# Patient Record
Sex: Female | Born: 1965 | Hispanic: No | Marital: Married | Smoking: Current every day smoker
Health system: Southern US, Community
[De-identification: ages and names within clinical notes are randomized; demographics above are authoritative.]

## PROBLEM LIST (undated history)

## (undated) DIAGNOSIS — R109 Unspecified abdominal pain: Secondary | ICD-10-CM

## (undated) DIAGNOSIS — J351 Hypertrophy of tonsils: Secondary | ICD-10-CM

## (undated) DIAGNOSIS — F329 Major depressive disorder, single episode, unspecified: Secondary | ICD-10-CM

## (undated) DIAGNOSIS — J019 Acute sinusitis, unspecified: Secondary | ICD-10-CM

## (undated) DIAGNOSIS — D649 Anemia, unspecified: Secondary | ICD-10-CM

## (undated) DIAGNOSIS — E66813 Obesity, class 3: Secondary | ICD-10-CM

## (undated) DIAGNOSIS — F32A Depression, unspecified: Secondary | ICD-10-CM

## (undated) DIAGNOSIS — F419 Anxiety disorder, unspecified: Secondary | ICD-10-CM

## (undated) DIAGNOSIS — N390 Urinary tract infection, site not specified: Secondary | ICD-10-CM

## (undated) DIAGNOSIS — I10 Essential (primary) hypertension: Secondary | ICD-10-CM

## (undated) DIAGNOSIS — R002 Palpitations: Secondary | ICD-10-CM

## (undated) DIAGNOSIS — K921 Melena: Secondary | ICD-10-CM

## (undated) DIAGNOSIS — M542 Cervicalgia: Secondary | ICD-10-CM

## (undated) HISTORY — DX: Depression, unspecified: F32.A

## (undated) HISTORY — DX: Palpitations: R00.2

## (undated) HISTORY — DX: Cervicalgia: M54.2

## (undated) HISTORY — DX: Hypertrophy of tonsils: J35.1

## (undated) HISTORY — DX: Anemia, unspecified: D64.9

## (undated) HISTORY — DX: Urinary tract infection, site not specified: N39.0

## (undated) HISTORY — DX: Anxiety disorder, unspecified: F41.9

## (undated) HISTORY — DX: Melena: K92.1

## (undated) HISTORY — DX: Morbid (severe) obesity due to excess calories: E66.01

## (undated) HISTORY — DX: Essential (primary) hypertension: I10

## (undated) HISTORY — DX: Acute sinusitis, unspecified: J01.90

## (undated) HISTORY — DX: Obesity, class 3: E66.813

## (undated) HISTORY — PX: OTHER SURGICAL HISTORY: SHX169

## (undated) HISTORY — PX: HYSTEROSCOPY WITH D & C: SHX1775

## (undated) HISTORY — PX: CHOLECYSTECTOMY: SHX55

## (undated) HISTORY — PX: CARPAL TUNNEL RELEASE: SHX101

## (undated) HISTORY — DX: Unspecified abdominal pain: R10.9

---
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
## 1996-10-21 HISTORY — PX: OTHER SURGICAL HISTORY: SHX169

## 1998-04-03 ENCOUNTER — Ambulatory Visit (HOSPITAL_COMMUNITY): Admission: RE | Admit: 1998-04-03 | Discharge: 1998-04-04 | Payer: Self-pay | Admitting: General Surgery

## 1998-07-14 ENCOUNTER — Other Ambulatory Visit: Admission: RE | Admit: 1998-07-14 | Discharge: 1998-07-14 | Payer: Self-pay | Admitting: Obstetrics & Gynecology

## 1998-11-21 ENCOUNTER — Ambulatory Visit (HOSPITAL_COMMUNITY): Admission: RE | Admit: 1998-11-21 | Discharge: 1998-11-21 | Payer: Self-pay | Admitting: Obstetrics and Gynecology

## 1998-11-21 ENCOUNTER — Encounter: Payer: Self-pay | Admitting: Obstetrics and Gynecology

## 1999-02-08 ENCOUNTER — Inpatient Hospital Stay (HOSPITAL_COMMUNITY): Admission: AD | Admit: 1999-02-08 | Discharge: 1999-02-11 | Payer: Self-pay | Admitting: Obstetrics and Gynecology

## 1999-02-11 ENCOUNTER — Encounter (HOSPITAL_COMMUNITY): Admission: RE | Admit: 1999-02-11 | Discharge: 1999-05-12 | Payer: Self-pay | Admitting: Obstetrics and Gynecology

## 1999-05-18 ENCOUNTER — Encounter (HOSPITAL_COMMUNITY): Admission: RE | Admit: 1999-05-18 | Discharge: 1999-08-16 | Payer: Self-pay | Admitting: Obstetrics and Gynecology

## 2000-03-31 ENCOUNTER — Ambulatory Visit (HOSPITAL_COMMUNITY): Admission: RE | Admit: 2000-03-31 | Discharge: 2000-03-31 | Payer: Self-pay | Admitting: Internal Medicine

## 2000-05-14 ENCOUNTER — Other Ambulatory Visit: Admission: RE | Admit: 2000-05-14 | Discharge: 2000-05-14 | Payer: Self-pay | Admitting: *Deleted

## 2001-05-27 ENCOUNTER — Other Ambulatory Visit: Admission: RE | Admit: 2001-05-27 | Discharge: 2001-05-27 | Payer: Self-pay | Admitting: *Deleted

## 2002-04-28 ENCOUNTER — Encounter: Payer: Self-pay | Admitting: Internal Medicine

## 2002-04-28 ENCOUNTER — Encounter: Admission: RE | Admit: 2002-04-28 | Discharge: 2002-04-28 | Payer: Self-pay | Admitting: Internal Medicine

## 2002-10-04 ENCOUNTER — Other Ambulatory Visit: Admission: RE | Admit: 2002-10-04 | Discharge: 2002-10-04 | Payer: Self-pay | Admitting: *Deleted

## 2003-01-06 ENCOUNTER — Encounter: Admission: RE | Admit: 2003-01-06 | Discharge: 2003-01-06 | Payer: Self-pay | Admitting: Internal Medicine

## 2003-01-06 ENCOUNTER — Encounter: Payer: Self-pay | Admitting: Internal Medicine

## 2003-09-21 HISTORY — PX: TOTAL ABDOMINAL HYSTERECTOMY: SHX209

## 2004-10-24 ENCOUNTER — Other Ambulatory Visit: Admission: RE | Admit: 2004-10-24 | Discharge: 2004-10-24 | Payer: Self-pay | Admitting: *Deleted

## 2005-11-12 ENCOUNTER — Other Ambulatory Visit: Admission: RE | Admit: 2005-11-12 | Discharge: 2005-11-12 | Payer: Self-pay | Admitting: *Deleted

## 2007-04-02 ENCOUNTER — Other Ambulatory Visit: Admission: RE | Admit: 2007-04-02 | Discharge: 2007-04-02 | Payer: Self-pay | Admitting: *Deleted

## 2007-12-29 ENCOUNTER — Encounter: Admission: RE | Admit: 2007-12-29 | Discharge: 2007-12-29 | Payer: Self-pay | Admitting: *Deleted

## 2008-12-29 ENCOUNTER — Encounter: Admission: RE | Admit: 2008-12-29 | Discharge: 2008-12-29 | Payer: Self-pay | Admitting: Obstetrics and Gynecology

## 2010-01-11 ENCOUNTER — Encounter: Admission: RE | Admit: 2010-01-11 | Discharge: 2010-01-11 | Payer: Self-pay | Admitting: Obstetrics and Gynecology

## 2010-12-27 ENCOUNTER — Other Ambulatory Visit: Payer: Self-pay | Admitting: Obstetrics and Gynecology

## 2010-12-27 DIAGNOSIS — Z1231 Encounter for screening mammogram for malignant neoplasm of breast: Secondary | ICD-10-CM

## 2011-01-15 ENCOUNTER — Ambulatory Visit
Admission: RE | Admit: 2011-01-15 | Discharge: 2011-01-15 | Disposition: A | Discharge status: Home / Self Care | Payer: 59 | Source: Ambulatory Visit | Attending: Obstetrics and Gynecology | Admitting: Obstetrics and Gynecology

## 2011-01-15 DIAGNOSIS — Z1231 Encounter for screening mammogram for malignant neoplasm of breast: Secondary | ICD-10-CM

## 2011-09-20 ENCOUNTER — Emergency Department (HOSPITAL_COMMUNITY)
Admission: EM | Admit: 2011-09-20 | Discharge: 2011-09-20 | Disposition: A | Discharge status: Home / Self Care | Payer: 59 | Attending: Emergency Medicine | Admitting: Emergency Medicine

## 2011-09-20 ENCOUNTER — Encounter: Payer: Self-pay | Admitting: Emergency Medicine

## 2011-09-20 DIAGNOSIS — R350 Frequency of micturition: Secondary | ICD-10-CM | POA: Insufficient documentation

## 2011-09-20 DIAGNOSIS — R300 Dysuria: Secondary | ICD-10-CM | POA: Insufficient documentation

## 2011-09-20 DIAGNOSIS — N390 Urinary tract infection, site not specified: Secondary | ICD-10-CM | POA: Insufficient documentation

## 2011-09-20 DIAGNOSIS — R319 Hematuria, unspecified: Secondary | ICD-10-CM | POA: Insufficient documentation

## 2011-09-20 LAB — URINE MICROSCOPIC-ADD ON

## 2011-09-20 LAB — URINALYSIS, ROUTINE W REFLEX MICROSCOPIC
Bilirubin Urine: NEGATIVE
Specific Gravity, Urine: 1.011 (ref 1.005–1.030)
pH: 6 (ref 5.0–8.0)

## 2011-09-20 MED ORDER — CEFPODOXIME PROXETIL 200 MG PO TABS
200.0000 mg | ORAL_TABLET | Freq: Once | ORAL | Status: AC
  Administered 2011-09-20: 200 mg via ORAL
  Filled 2011-09-20: qty 1

## 2011-09-20 MED ORDER — CEFPODOXIME PROXETIL 200 MG PO TABS: 200.0000 mg | ORAL_TABLET | Freq: Two times a day (BID) | ORAL | Status: AC

## 2011-09-20 MED ORDER — PHENAZOPYRIDINE HCL 200 MG PO TABS: 200.0000 mg | ORAL_TABLET | Freq: Three times a day (TID) | ORAL | Status: AC

## 2011-09-20 MED ORDER — PHENAZOPYRIDINE HCL 100 MG PO TABS
200.0000 mg | ORAL_TABLET | Freq: Three times a day (TID) | ORAL | Status: DC
  Administered 2011-09-20: 200 mg via ORAL
  Filled 2011-09-20: qty 2

## 2011-09-20 NOTE — ED Notes (Signed)
PT. REPORTS HEMATURIA THIS MORNING WITH DYSURIA .

## 2011-09-20 NOTE — ED Provider Notes (Signed)
History     CSN: [id number] Arrival date & time: 09/20/2011  4:14 AM   First MD Initiated Contact with Patient 09/20/11 [phone]      Chief Complaint  Patient presents with  . Hematuria    (Consider location/radiation/quality/duration/timing/severity/associated sxs/prior treatment) Patient is a 45 y.o. female presenting with dysuria. The history is provided by the patient.  Dysuria  This is a new problem. The problem occurs every urination. The problem has been gradually worsening. The quality of the pain is described as burning. The pain is moderate. There has been no fever. There is no history of pyelonephritis. Associated symptoms include frequency and hematuria. Pertinent negatives include no chills, no sweats, no nausea, no vomiting, no discharge, no hesitancy, no possible pregnancy and no urgency. She has tried nothing for the symptoms. Her past medical history does not include kidney stones or recurrent UTIs.   moderate in severity. Feels like a bladder infection with history of the same. No radiation of pain. Sharp and burning in quality. No associated vomiting or fevers. No back pain. No aggravating factors otherwise in no alleviating factors.  History reviewed. No pertinent past medical history.  Past Surgical History  Procedure Date  . Cesarean section   . Abdominal hysterectomy     No family history on file.  History  Substance Use Topics  . Smoking status: Never Smoker   . Smokeless tobacco: Not on file  . Alcohol Use: No    OB History    Grav Para Term Preterm Abortions TAB SAB Ect Mult Living                  Review of Systems  Constitutional: Negative for fever and chills.  HENT: Negative for neck pain and neck stiffness.   Eyes: Negative for pain.  Respiratory: Negative for shortness of breath.   Cardiovascular: Negative for chest pain.  Gastrointestinal: Negative for nausea, vomiting and abdominal pain.  Genitourinary: Positive for dysuria, frequency  and hematuria. Negative for hesitancy and urgency.  Musculoskeletal: Negative for back pain.  Skin: Negative for rash.  Neurological: Negative for headaches.  All other systems reviewed and are negative.    Allergies  Review of patient's allergies indicates no known allergies.  Home Medications   Current Outpatient Rx  Name Route Sig Dispense Refill  . PHENTERMINE HCL 37.5 MG PO CAPS Oral Take 37.5 mg by mouth every morning.        BP 138/93  Pulse 86  Temp(Src) 98.7 F (37.1 C) (Oral)  Resp 14  SpO2 99%  Physical Exam  Constitutional: She is oriented to person, place, and time. She appears well-developed and well-nourished.  HENT:  Head: Normocephalic and atraumatic.  Eyes: Conjunctivae and EOM are normal. Pupils are equal, round, and reactive to light.  Neck: Trachea normal. Neck supple. No thyromegaly present.  Cardiovascular: Normal rate, regular rhythm, S1 normal, S2 normal and normal pulses.     No systolic murmur is present   No diastolic murmur is present  Pulses:      Radial pulses are 2+ on the right side, and 2+ on the left side.  Pulmonary/Chest: Effort normal and breath sounds normal. She has no wheezes. She has no rhonchi. She has no rales. She exhibits no tenderness.  Abdominal: Soft. Normal appearance and bowel sounds are normal. There is no tenderness. There is no CVA tenderness and negative Murphy's sign.  Musculoskeletal:       BLE:s Calves nontender, no cords or erythema, negative  Homans sign  Neurological: She is alert and oriented to person, place, and time. She has normal strength. No cranial nerve deficit or sensory deficit. GCS eye subscore is 4. GCS verbal subscore is 5. GCS motor subscore is 6.  Skin: Skin is warm and dry. No rash noted. She is not diaphoretic.  Psychiatric: Her speech is normal.       Cooperative and appropriate    ED Course  Procedures (including critical care time)  Labs Reviewed  URINALYSIS, ROUTINE W REFLEX  MICROSCOPIC - Abnormal; Notable for the following:    Color, Urine RED (*) BIOCHEMICALS MAY BE AFFECTED BY COLOR   APPearance TURBID (*)    Hgb urine dipstick LARGE (*)    Ketones, ur 15 (*)    Protein, ur 100 (*)    Leukocytes, UA LARGE (*)    All other components within normal limits  URINE MICROSCOPIC-ADD ON  URINE CULTURE    Nurses notes reviewed.  Pulse ox 99% on room air is adequate.  MDM  UTI symptoms. UA reviewed. Urine culture sent. No vomiting or fevers or clinical pyelonephritis. Prescription for antibiotics provided and patient stable for discharge home.        Sunnie Nielsen, MD 09/20/11 [phone]

## 2011-09-22 LAB — URINE CULTURE

## 2011-09-23 NOTE — ED Notes (Signed)
+   urine Patient treated with vantin-sensitive to same-chart appended per protocol MD

## 2011-10-22 DIAGNOSIS — I10 Essential (primary) hypertension: Secondary | ICD-10-CM

## 2011-10-22 HISTORY — DX: Essential (primary) hypertension: I10

## 2011-12-16 ENCOUNTER — Other Ambulatory Visit: Payer: Self-pay | Admitting: Obstetrics and Gynecology

## 2011-12-16 DIAGNOSIS — Z1231 Encounter for screening mammogram for malignant neoplasm of breast: Secondary | ICD-10-CM

## 2012-01-17 ENCOUNTER — Ambulatory Visit
Admission: RE | Admit: 2012-01-17 | Discharge: 2012-01-17 | Disposition: A | Discharge status: Home / Self Care | Payer: 59 | Source: Ambulatory Visit | Attending: Obstetrics and Gynecology | Admitting: Obstetrics and Gynecology

## 2012-01-17 DIAGNOSIS — Z1231 Encounter for screening mammogram for malignant neoplasm of breast: Secondary | ICD-10-CM

## 2012-10-20 ENCOUNTER — Other Ambulatory Visit: Payer: Self-pay | Admitting: Obstetrics and Gynecology

## 2012-10-20 DIAGNOSIS — Z1231 Encounter for screening mammogram for malignant neoplasm of breast: Secondary | ICD-10-CM

## 2013-01-18 ENCOUNTER — Ambulatory Visit
Admission: RE | Admit: 2013-01-18 | Discharge: 2013-01-18 | Disposition: A | Discharge status: Home / Self Care | Payer: 59 | Source: Ambulatory Visit | Attending: Obstetrics and Gynecology | Admitting: Obstetrics and Gynecology

## 2013-01-18 DIAGNOSIS — Z1231 Encounter for screening mammogram for malignant neoplasm of breast: Secondary | ICD-10-CM

## 2013-12-02 ENCOUNTER — Encounter: Payer: Self-pay | Admitting: Interventional Cardiology

## 2013-12-02 ENCOUNTER — Ambulatory Visit (INDEPENDENT_AMBULATORY_CARE_PROVIDER_SITE_OTHER): Payer: 59 | Admitting: Interventional Cardiology

## 2013-12-02 VITALS — BP 142/84 | HR 74 | Ht 63.0 in | Wt 255.0 lb

## 2013-12-02 DIAGNOSIS — I10 Essential (primary) hypertension: Secondary | ICD-10-CM

## 2013-12-02 DIAGNOSIS — R079 Chest pain, unspecified: Secondary | ICD-10-CM

## 2013-12-02 DIAGNOSIS — R002 Palpitations: Secondary | ICD-10-CM

## 2013-12-02 NOTE — Patient Instructions (Addendum)
Your physician has requested that you have an exercise stress myoview. For further information please visit [email or web address]. Please follow instruction sheet, as given.  Your physician has recommended that you wear a 48 holter monitor. Holter monitors are medical devices that record the heart's electrical activity. Doctors most often use these monitors to diagnose arrhythmias. Arrhythmias are problems with the speed or rhythm of the heartbeat. The monitor is a small, portable device. You can wear one while you do your normal daily activities. This is usually used to diagnose what is causing palpitations/syncope (passing out).    Your physician recommends that you continue on your current medications as directed. Please refer to the Current Medication list given to you today.  Your physician recommends that you schedule a follow-up appointment as needed with Dr. Katrinka BlazingSmith

## 2013-12-02 NOTE — Progress Notes (Signed)
Patient ID: Catherine Wheeler, female   DOB: [birth year], 48 y.o.   MRN: [id number]   Date: 12/02/2013 ID: Catherine Wheeler, DOB [birth year], MRN [id number] PCP: Pearla DubonnetGATES,ROBERT NEVILL, MD  Reason: Chest discomfort and palpitations  ASSESSMENT;  1. Upper chest and neck discomfort, possibly musculoskeletal. Rule out atypical ischemia presentation  2. Palpitations, suspect PACs or PVCs 3. Obesity 4. Snoring 5. Hyperlipidemia 6. Hypertension  PLAN:  1. 48 hour Holter monitor 2. Exercise treadmill test 3. Consider echocardiography depending upon findings of above 2 tests   SUBJECTIVE: Catherine Wheeler is a 48 y.o. female who is referred by Dr. Jerelyn ScottN.. Gates for evaluation of chest discomfort or palpitations. Starting 2 months ago, the patient began experiencing momentary episodes of fluttering heart action. It seemed to be exertion related. His become very common and daily over the past month. Episodes last less than 5 seconds. They cause her to feel short of breath. She denies orthopnea, PND, and prolonged tachycardia/palpitations. No history of fainting.  Over the past month she has experienced upper chest and neck discomfort with cleaning, and activities where she uses her arms and hands to work above her head. Taking a shower and watching her hair can produce the discomfort. He goes away promptly with rest. There is no associated dyspnea.   No Known Allergies  No current outpatient prescriptions on file prior to visit.   No current facility-administered medications on file prior to visit.    Past Medical History  Diagnosis Date  . Anemia, unspecified   . Blood in stool   . Cervicalgia   . Acute sinusitis   . UTI (urinary tract infection)   . Obesity, Class III, BMI 40-49.9 (morbid obesity)   . Enlarged tonsils   . Hypertension 10/2011  . Abdominal pain   . Palpitations     Past Surgical History  Procedure Laterality Date  . Cesarean section    . Total abdominal hysterectomy  09/2003     Ovaries preserved, supracervical  . Carpal tunnel release    . Hysteroscopy w/d&c      x 3  . Laparoscopic ablation of bilateral ovarian cysts    . Cholecystectomy      History   Social History  . Marital Status: Married    Spouse Name: N/A    Number of Children: N/A  . Years of Education: N/A   Occupational History  . Not on file.   Social History Main Topics  . Smoking status: Never Smoker   . Smokeless tobacco: Not on file  . Alcohol Use: No  . Drug Use: No  . Sexual Activity: Not on file   Other Topics Concern  . Not on file   Social History Narrative  . No narrative on file    Family History  Problem Relation Age of Onset  . Adopted: Yes    ROS: Obese. No episodes of syncope. Denies orthopnea. Able to work.. Other systems negative for complaints.  OBJECTIVE: BP 142/84  Pulse 74  Ht 5\' 3"  (1.6 m)  Wt 255 lb (115.667 kg)  BMI 45.18 kg/m2,  General: No acute distress, obese  HEENT: normal  Neck: JVD flat. Carotids no bruit with 2+ upstroke Chest: Clear Cardiac: Murmur: 1/6 systolic. Gallop: S4. Rhythm: Regular. Other: None Abdomen: Bruit: Absent. Pulsation: Absent Extremities: Edema: Absent. Pulses: 2+ Neuro: Normal Psych: Normal  ECG: Normal sinus rhythm with normal tracing   Medical History: Obesity, Enlarged tonsils, hypertension, diagnosis, January, 2013, Episodic sinusitis

## 2013-12-10 ENCOUNTER — Encounter: Payer: Self-pay | Admitting: Radiology

## 2013-12-10 ENCOUNTER — Encounter (INDEPENDENT_AMBULATORY_CARE_PROVIDER_SITE_OTHER): Payer: 59

## 2013-12-10 DIAGNOSIS — R002 Palpitations: Secondary | ICD-10-CM

## 2013-12-10 NOTE — Progress Notes (Signed)
Patient ID: Catherine Wheeler, female   DOB: 1965-12-13, [age] y.o.   MRN: [id number] Evo 48hr holter monitor applied

## 2013-12-20 ENCOUNTER — Other Ambulatory Visit: Payer: Self-pay

## 2013-12-20 DIAGNOSIS — Z1231 Encounter for screening mammogram for malignant neoplasm of breast: Secondary | ICD-10-CM

## 2013-12-22 ENCOUNTER — Telehealth: Payer: Self-pay

## 2013-12-22 ENCOUNTER — Other Ambulatory Visit: Payer: Self-pay

## 2013-12-22 DIAGNOSIS — R079 Chest pain, unspecified: Secondary | ICD-10-CM

## 2013-12-22 NOTE — Telephone Encounter (Signed)
pt aware nuclear stress rest cancelled not covered by pt insurance.pt wil have gxt.pt adv that Dr.Smith is aware and agreeable.pt given gxt instructions and verbalized understanding.pt adv that scheduling will call her to schedule.pt agreeable with plan and verba;ized understanding.

## 2013-12-23 ENCOUNTER — Encounter (HOSPITAL_COMMUNITY): Payer: 59

## 2013-12-30 ENCOUNTER — Telehealth: Payer: Self-pay

## 2013-12-30 NOTE — Telephone Encounter (Signed)
pt given holter monitor results.NSR with rare PAC, PVC.No Afib/flutter. overall unremarkable.pt verbalized understanding.

## 2014-01-07 ENCOUNTER — Ambulatory Visit (HOSPITAL_COMMUNITY)
Admission: RE | Admit: 2014-01-07 | Discharge: 2014-01-07 | Disposition: A | Discharge status: Home / Self Care | Payer: 59 | Source: Ambulatory Visit | Attending: Internal Medicine | Admitting: Internal Medicine

## 2014-01-07 DIAGNOSIS — R079 Chest pain, unspecified: Secondary | ICD-10-CM

## 2014-01-10 ENCOUNTER — Telehealth: Payer: Self-pay | Admitting: Interventional Cardiology

## 2014-01-10 NOTE — Telephone Encounter (Signed)
Exercise treadmill is normal. No evidence of blocked arteries.

## 2014-01-12 NOTE — Telephone Encounter (Signed)
called to give pt gaxt results.lmom for pt to call back

## 2014-01-20 ENCOUNTER — Ambulatory Visit
Admission: RE | Admit: 2014-01-20 | Discharge: 2014-01-20 | Disposition: A | Discharge status: Home / Self Care | Payer: 59 | Source: Ambulatory Visit

## 2014-01-20 DIAGNOSIS — Z1231 Encounter for screening mammogram for malignant neoplasm of breast: Secondary | ICD-10-CM

## 2015-01-03 ENCOUNTER — Other Ambulatory Visit: Payer: Self-pay

## 2015-01-03 DIAGNOSIS — Z1231 Encounter for screening mammogram for malignant neoplasm of breast: Secondary | ICD-10-CM

## 2015-01-24 ENCOUNTER — Ambulatory Visit
Admission: RE | Admit: 2015-01-24 | Discharge: 2015-01-24 | Disposition: A | Discharge status: Home / Self Care | Payer: 59 | Source: Ambulatory Visit

## 2015-01-24 DIAGNOSIS — Z1231 Encounter for screening mammogram for malignant neoplasm of breast: Secondary | ICD-10-CM

## 2015-12-19 ENCOUNTER — Other Ambulatory Visit: Payer: Self-pay

## 2015-12-19 DIAGNOSIS — Z1231 Encounter for screening mammogram for malignant neoplasm of breast: Secondary | ICD-10-CM

## 2016-01-26 ENCOUNTER — Ambulatory Visit
Admission: RE | Admit: 2016-01-26 | Discharge: 2016-01-26 | Disposition: A | Discharge status: Home / Self Care | Payer: Commercial Managed Care - HMO | Source: Ambulatory Visit

## 2016-01-26 DIAGNOSIS — Z1231 Encounter for screening mammogram for malignant neoplasm of breast: Secondary | ICD-10-CM

## 2017-01-10 ENCOUNTER — Other Ambulatory Visit: Payer: Self-pay | Admitting: Obstetrics and Gynecology

## 2017-01-10 DIAGNOSIS — Z1231 Encounter for screening mammogram for malignant neoplasm of breast: Secondary | ICD-10-CM

## 2017-02-07 ENCOUNTER — Ambulatory Visit
Admission: RE | Admit: 2017-02-07 | Discharge: 2017-02-07 | Disposition: A | Discharge status: Home / Self Care | Payer: Commercial Managed Care - HMO | Source: Ambulatory Visit | Attending: Obstetrics and Gynecology | Admitting: Obstetrics and Gynecology

## 2017-02-07 DIAGNOSIS — Z1231 Encounter for screening mammogram for malignant neoplasm of breast: Secondary | ICD-10-CM

## 2018-01-02 ENCOUNTER — Other Ambulatory Visit: Payer: Self-pay | Admitting: Obstetrics and Gynecology

## 2018-01-02 DIAGNOSIS — Z1231 Encounter for screening mammogram for malignant neoplasm of breast: Secondary | ICD-10-CM

## 2018-02-13 ENCOUNTER — Ambulatory Visit
Admission: RE | Admit: 2018-02-13 | Discharge: 2018-02-13 | Disposition: A | Discharge status: Home / Self Care | Payer: Commercial Managed Care - HMO | Source: Ambulatory Visit | Attending: Obstetrics and Gynecology | Admitting: Obstetrics and Gynecology

## 2018-02-13 DIAGNOSIS — Z1231 Encounter for screening mammogram for malignant neoplasm of breast: Secondary | ICD-10-CM

## 2019-04-27 ENCOUNTER — Other Ambulatory Visit: Payer: Self-pay | Admitting: Obstetrics and Gynecology

## 2019-04-27 DIAGNOSIS — Z1231 Encounter for screening mammogram for malignant neoplasm of breast: Secondary | ICD-10-CM

## 2019-06-09 ENCOUNTER — Other Ambulatory Visit: Payer: Self-pay

## 2019-06-09 ENCOUNTER — Ambulatory Visit
Admission: RE | Admit: 2019-06-09 | Discharge: 2019-06-09 | Disposition: A | Discharge status: Home / Self Care | Payer: 59 | Source: Ambulatory Visit | Attending: Obstetrics and Gynecology | Admitting: Obstetrics and Gynecology

## 2019-06-09 DIAGNOSIS — Z1231 Encounter for screening mammogram for malignant neoplasm of breast: Secondary | ICD-10-CM

## 2019-11-10 ENCOUNTER — Encounter: Payer: Self-pay | Admitting: Internal Medicine

## 2019-12-20 ENCOUNTER — Other Ambulatory Visit: Payer: Self-pay

## 2019-12-20 ENCOUNTER — Ambulatory Visit (AMBULATORY_SURGERY_CENTER): Payer: Self-pay | Admitting: *Deleted

## 2019-12-20 DIAGNOSIS — Z1211 Encounter for screening for malignant neoplasm of colon: Secondary | ICD-10-CM

## 2019-12-20 DIAGNOSIS — Z1159 Encounter for screening for other viral diseases: Secondary | ICD-10-CM

## 2019-12-20 NOTE — Progress Notes (Signed)
Patient denies any allergies to egg or soy products. Patient had PONV with earlier surgeries back in the 90s.    Patient denies oxygen use at home.  Patient is on diethylpropion (diet medication). Emmi instructions for colonoscopy/endoscopy explained and given to patient.

## 2019-12-29 ENCOUNTER — Other Ambulatory Visit: Payer: Self-pay | Admitting: Internal Medicine

## 2019-12-29 ENCOUNTER — Ambulatory Visit (INDEPENDENT_AMBULATORY_CARE_PROVIDER_SITE_OTHER): Payer: BC Managed Care – PPO

## 2019-12-29 DIAGNOSIS — Z1159 Encounter for screening for other viral diseases: Secondary | ICD-10-CM

## 2019-12-30 LAB — SARS CORONAVIRUS 2 (TAT 6-24 HRS): SARS Coronavirus 2: NEGATIVE

## 2019-12-31 ENCOUNTER — Encounter: Payer: Self-pay | Admitting: Internal Medicine

## 2020-01-03 ENCOUNTER — Other Ambulatory Visit: Payer: Self-pay

## 2020-01-03 ENCOUNTER — Encounter: Payer: Self-pay | Admitting: Internal Medicine

## 2020-01-03 ENCOUNTER — Ambulatory Visit (AMBULATORY_SURGERY_CENTER): Payer: BC Managed Care – PPO | Admitting: Internal Medicine

## 2020-01-03 VITALS — BP 133/69 | HR 56 | Temp 97.8°F | Resp 13 | Ht 64.5 in | Wt 235.0 lb

## 2020-01-03 DIAGNOSIS — Z1211 Encounter for screening for malignant neoplasm of colon: Secondary | ICD-10-CM | POA: Diagnosis present

## 2020-01-03 MED ORDER — SODIUM CHLORIDE 0.9 % IV SOLN: 500.0000 mL | Freq: Once | INTRAVENOUS | Status: DC

## 2020-01-03 NOTE — Progress Notes (Signed)
Temp by JB Vitals by CW  Pt's states no medical or surgical changes since previsit or office visit.  

## 2020-01-03 NOTE — Patient Instructions (Addendum)
No polyps, no cancer seen.  You do have diverticulosis - thickened muscle rings and pouches in the colon wall. Please read the handout about this condition.  Next routine colonoscopy or other screening test in 10 years - 2031  I appreciate the opportunity to care for you. Iva Boop, MD, FACG  YOU HAD AN ENDOSCOPIC PROCEDURE TODAY AT THE [facility]:   Refer to the procedure report that was given to you for any specific questions about what was found during the examination.  If the procedure report does not answer your questions, please call your gastroenterologist to clarify.  If you requested that your care partner not be given the details of your procedure findings, then the procedure report has been included in a sealed envelope for you to review at your convenience later.  YOU SHOULD EXPECT: Some feelings of bloating in the abdomen. Passage of more gas than usual.  Walking can help get rid of the air that was put into your GI tract during the procedure and reduce the bloating. If you had a lower endoscopy (such as a colonoscopy or flexible sigmoidoscopy) you may notice spotting of blood in your stool or on the toilet paper. If you underwent a bowel prep for your procedure, you may not have a normal bowel movement for a few days.  Please Note:  You might notice some irritation and congestion in your nose or some drainage.  This is from the oxygen used during your procedure.  There is no need for concern and it should clear up in a day or so.  SYMPTOMS TO REPORT IMMEDIATELY:   Following lower endoscopy (colonoscopy or flexible sigmoidoscopy):  Excessive amounts of blood in the stool  Significant tenderness or worsening of abdominal pains  Swelling of the abdomen that is new, acute  Fever of 100F or higher  For urgent or emergent issues, a gastroenterologist can be reached at any hour by calling [phone]. Do not use MyChart messaging for urgent concerns.     DIET:  We do recommend a small meal at first, but then you may proceed to your regular diet.  Drink plenty of fluids but you should avoid alcoholic beverages for 24 hours.  ACTIVITY:  You should plan to take it easy for the rest of today and you should NOT DRIVE or use heavy machinery until tomorrow (because of the sedation medicines used during the test).    FOLLOW UP: Our staff will call the number listed on your records 48-72 hours following your procedure to check on you and address any questions or concerns that you may have regarding the information given to you following your procedure. If we do not reach you, we will leave a message.  We will attempt to reach you two times.  During this call, we will ask if you have developed any symptoms of COVID 19. If you develop any symptoms (ie: fever, flu-like symptoms, shortness of breath, cough etc.) before then, please call [phone].  If you test positive for Covid 19 in the 2 weeks post procedure, please call and report this information to [place].    If any biopsies were taken you will be contacted by phone or by letter within the next 1-3 weeks.  Please call us at [phone] if you have not heard about the biopsies in 3 weeks.    SIGNATURES/CONFIDENTIALITY: You and/or your care partner have signed paperwork which will be entered into your electronic medical record.  These signatures  attest to the fact that that the information above on your After Visit Summary has been reviewed and is understood.  Full responsibility of the confidentiality of this discharge information lies with you and/or your care-partner.

## 2020-01-03 NOTE — Op Note (Signed)
[facility] Patient Name: Catherine Wheeler Procedure Date: 01/03/2020 9:11 AM MRN: [id number] Endoscopist: Iva Boop , MD Age: 54 Referring MD:  Date of Birth: 1966/01/05 Gender: Female Account #: [id number] Procedure:                Colonoscopy Indications:              Screening for colorectal malignant neoplasm, This                            is the patient's first colonoscopy Medicines:                Propofol per Anesthesia, Monitored Anesthesia Care Procedure:                Pre-Anesthesia Assessment:                           - Prior to the procedure, a History and Physical                            was performed, and patient medications and                            allergies were reviewed. The patient's tolerance of                            previous anesthesia was also reviewed. The risks                            and benefits of the procedure and the sedation                            options and risks were discussed with the patient.                            All questions were answered, and informed consent                            was obtained. Prior Anticoagulants: The patient has                            taken no previous anticoagulant or antiplatelet                            agents. ASA Grade Assessment: II - A patient with                            mild systemic disease. After reviewing the risks                            and benefits, the patient was deemed in                            satisfactory condition to undergo the procedure.  After obtaining informed consent, the colonoscope                            was passed under direct vision. Throughout the                            procedure, the patient's blood pressure, pulse, and                            oxygen saturations were monitored continuously. The                            Colonoscope was introduced through the anus and   advanced to the the cecum, identified by                            appendiceal orifice and ileocecal valve. The                            ileocecal valve, appendiceal orifice, and rectum                            were photographed. The quality of the bowel                            preparation was good. The bowel preparation used                            was Miralax via split dose instruction. Scope In: 9:16:31 AM Scope Out: 9:31:38 AM Scope Withdrawal Time: 0 hours 13 minutes 7 seconds  Total Procedure Duration: 0 hours 15 minutes 7 seconds  Findings:                 The perianal and digital rectal examinations were                            normal.                           Multiple diverticula were found in the sigmoid                            colon.                           The exam was otherwise without abnormality on                            direct and retroflexion views. Complications:            No immediate complications. Estimated blood loss:                            None. Estimated Blood Loss:     Estimated blood loss: none. Impression:               - Mild diverticulosis in the sigmoid  colon.                           - The examination was otherwise normal on direct                            and retroflexion views.                           - No specimens collected. Recommendation:           - Repeat colonoscopy in 10 years for screening                            purposes.                           - Resume previous diet.                           - Continue present medications. Gatha Mayer, MD 01/03/2020 9:37:53 AM This report has been signed electronically.

## 2020-01-03 NOTE — Progress Notes (Signed)
PT taken to PACU. Monitors in place. VSS. Report given to RN. 

## 2020-01-05 ENCOUNTER — Telehealth: Payer: Self-pay

## 2020-01-05 ENCOUNTER — Telehealth: Payer: Self-pay | Admitting: *Deleted

## 2020-01-05 NOTE — Telephone Encounter (Signed)
Message left

## 2020-01-05 NOTE — Telephone Encounter (Signed)
2nd follow up call made.  NALM 

## 2020-07-05 IMAGING — MG DIGITAL SCREENING BILATERAL MAMMOGRAM WITH TOMO AND CAD
6 of 10 series · 6 of 30 positions shown · non-contrast
Comparison: Previous exam(s).

CLINICAL DATA: Screening.

EXAM:
DIGITAL SCREENING BILATERAL MAMMOGRAM WITH TOMO AND CAD

[L MLO synth-2D]
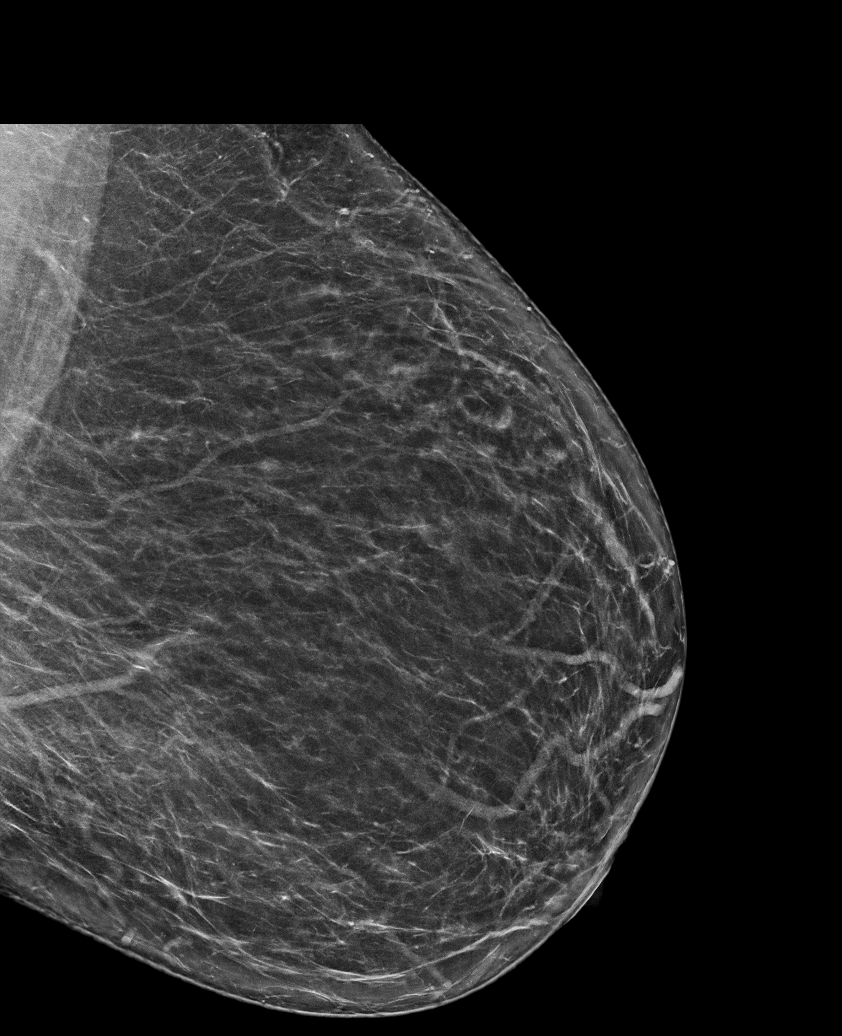

[R MLO synth-2D (1 of 2)]
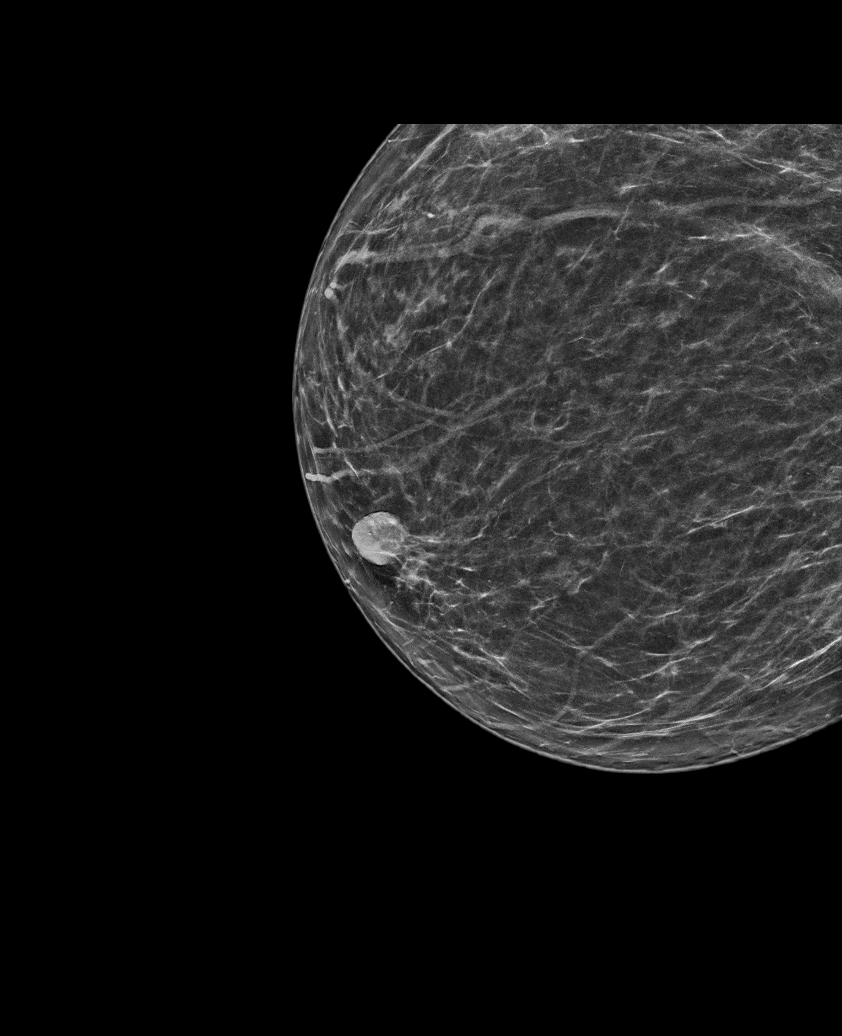

[L CC synth-2D]
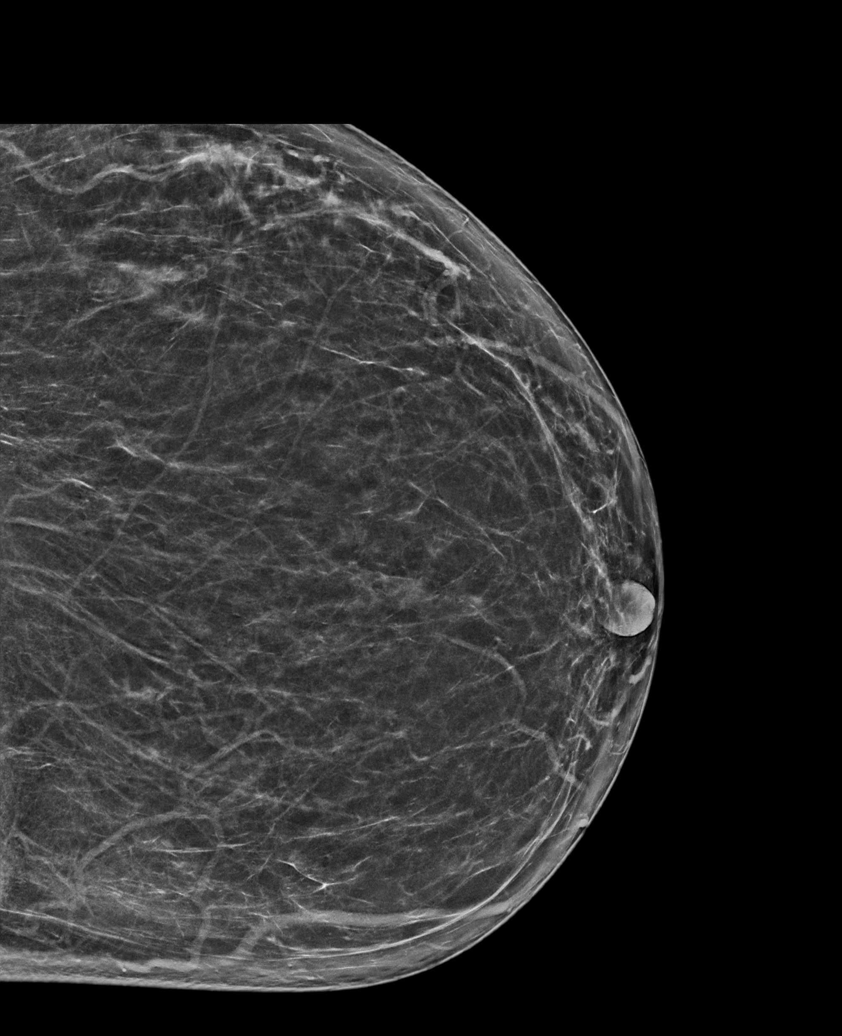

[R CC synth-2D]
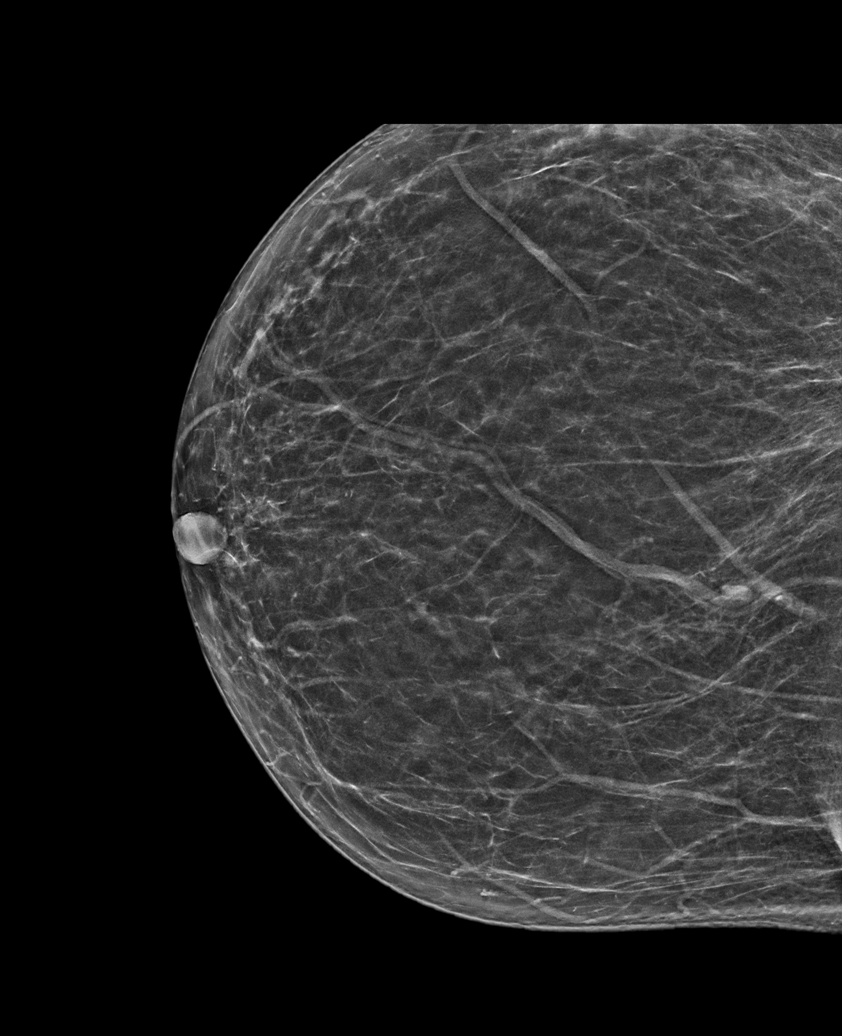

[R MLO synth-2D (2 of 2)]
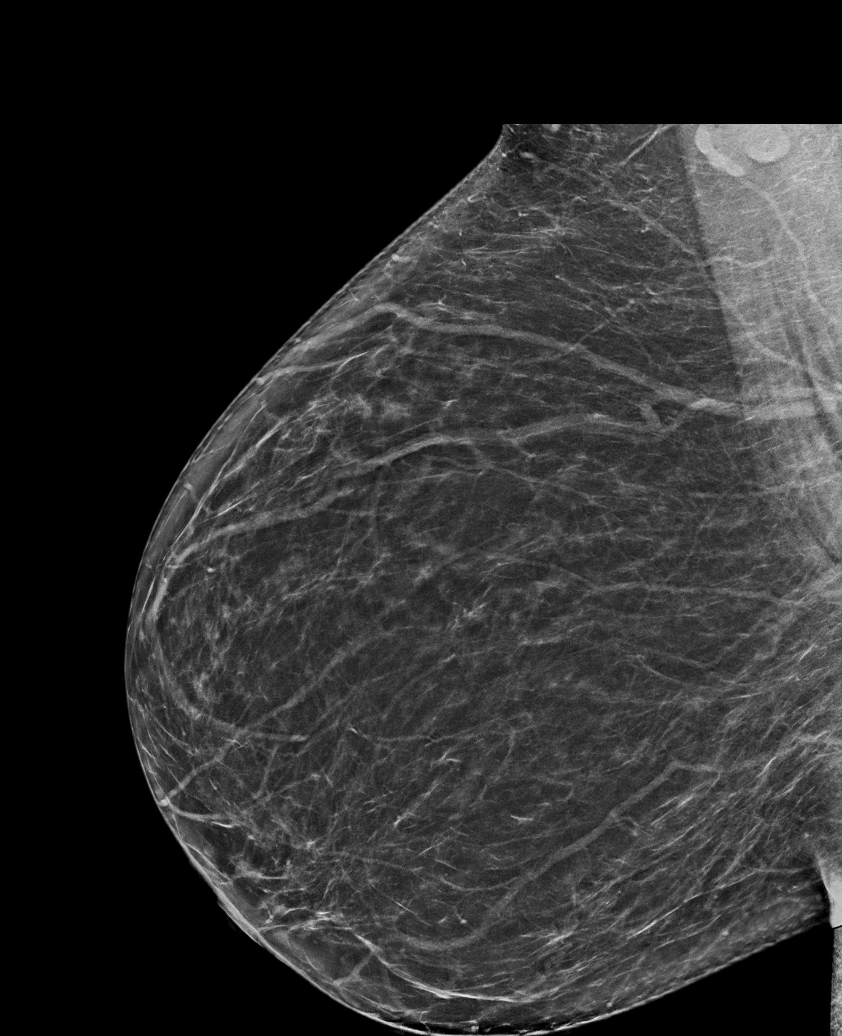

[R MLO tomo · tomo slice 37/74.0]
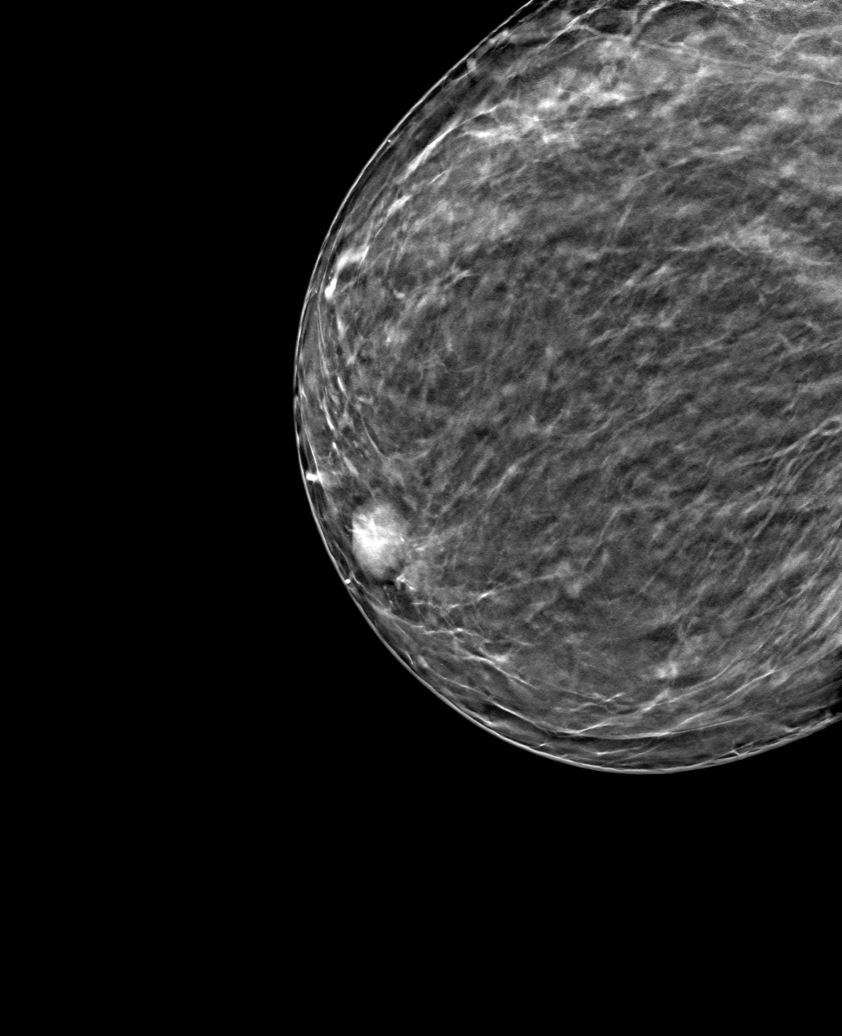

[6 of 30 positions shown; findings below may reference images not displayed]

ACR Breast Density Category b: There are scattered areas of
fibroglandular density.
FINDINGS: There are no findings suspicious for malignancy. Images were
processed with CAD.
IMPRESSION: No mammographic evidence of malignancy. A result letter of this
screening mammogram will be mailed directly to the patient.

RECOMMENDATION:
Screening mammogram in one year. (Code:[id number])

BI-RADS CATEGORY  1: Negative.

## 2020-07-12 ENCOUNTER — Other Ambulatory Visit: Payer: Self-pay | Admitting: Obstetrics and Gynecology

## 2020-07-12 DIAGNOSIS — Z1231 Encounter for screening mammogram for malignant neoplasm of breast: Secondary | ICD-10-CM

## 2020-09-07 ENCOUNTER — Other Ambulatory Visit: Payer: Self-pay

## 2020-09-07 ENCOUNTER — Ambulatory Visit
Admission: RE | Admit: 2020-09-07 | Discharge: 2020-09-07 | Disposition: A | Discharge status: Home / Self Care | Payer: BC Managed Care – PPO | Source: Ambulatory Visit | Attending: Obstetrics and Gynecology | Admitting: Obstetrics and Gynecology

## 2020-09-07 DIAGNOSIS — Z1231 Encounter for screening mammogram for malignant neoplasm of breast: Secondary | ICD-10-CM

## 2021-08-15 ENCOUNTER — Other Ambulatory Visit: Payer: Self-pay | Admitting: Obstetrics and Gynecology

## 2021-08-15 DIAGNOSIS — Z1231 Encounter for screening mammogram for malignant neoplasm of breast: Secondary | ICD-10-CM

## 2021-09-26 ENCOUNTER — Ambulatory Visit
Admission: RE | Admit: 2021-09-26 | Discharge: 2021-09-26 | Disposition: A | Discharge status: Home / Self Care | Payer: BC Managed Care – PPO | Source: Ambulatory Visit | Attending: Obstetrics and Gynecology | Admitting: Obstetrics and Gynecology

## 2021-09-26 DIAGNOSIS — Z1231 Encounter for screening mammogram for malignant neoplasm of breast: Secondary | ICD-10-CM

## 2022-05-01 ENCOUNTER — Encounter: Payer: Self-pay | Admitting: Internal Medicine

## 2022-05-01 ENCOUNTER — Ambulatory Visit (INDEPENDENT_AMBULATORY_CARE_PROVIDER_SITE_OTHER): Payer: BC Managed Care – PPO | Admitting: Internal Medicine

## 2022-05-01 VITALS — BP 120/74 | HR 68 | Ht 63.0 in | Wt 251.0 lb

## 2022-05-01 DIAGNOSIS — R12 Heartburn: Secondary | ICD-10-CM

## 2022-05-01 DIAGNOSIS — F439 Reaction to severe stress, unspecified: Secondary | ICD-10-CM

## 2022-05-01 DIAGNOSIS — J029 Acute pharyngitis, unspecified: Secondary | ICD-10-CM

## 2022-05-01 NOTE — Patient Instructions (Signed)
You have been scheduled for an endoscopy. Please follow written instructions given to you at your visit today. If you use inhalers (even only as needed), please bring them with you on the day of your procedure.  Stop your omeprazole.  If you are age 54 or older, your body mass index should be between 23-30. Your Body mass index is 44.46 kg/m. If this is out of the aforementioned range listed, please consider follow up with your Primary Care Provider.  If you are age 67 or younger, your body mass index should be between 19-25. Your Body mass index is 44.46 kg/m. If this is out of the aformentioned range listed, please consider follow up with your Primary Care Provider.   ________________________________________________________  The [facility] providers would like to encourage you to use [facility] to communicate with providers for non-urgent requests or questions.  Due to long hold times on the telephone, sending your provider a message by [facility] may be a faster and more efficient way to get a response.  Please allow 48 business hours for a response.  Please remember that this is for non-urgent requests.  _______________________________________________________   I appreciate the opportunity to care for you. Stan Head, MD, [facility]

## 2022-05-01 NOTE — Progress Notes (Signed)
Catherine Wheeler 56 y.o. 09/19/1966 [id number]  Assessment & Plan:   Encounter Diagnoses  Name Primary?   Heartburn Yes   Sore throat    Situational stress    Discontinue omeprazole.  This is not helping. Evaluate with EGD.  She may use as needed antacids in between. The risks and benefits as well as alternatives of endoscopic procedure(s) have been discussed and reviewed. All questions answered. The patient agrees to proceed.  Hold Ozempic 1 week before endoscopy to reduce risk of food retention in the stomach.  I think Ozempic may be related to these episodes of severe heartburn she has had a few times in the last couple of months.  (Delayed gastric emptying).  CC: Catherine Wheeler  Catherine Wheeler  Subjective:   Chief Complaint: GERD, something caught in my throat  HPI 56 year old woman with a history of hypertension and obesity here because of GERD issues.  She had seen Dr. Jenne Pane of ENT in February 2023 on twice daily omeprazole she was not sure that it was helping, she was having a burning sensation in the throat that comes and goes.  At that point no dysphagia and no globus.  Because of persistent GERD symptoms not responding to twice daily omeprazole he reduced to daily omeprazole and recommended she see GI.  She has had a negative fiberoptic laryngoscopy previously in January 2023.  She says it often feels like there is something in her throat like a pressure.  She has had several episodes or at least a few episodes of severe reflux and heartburn.  That seems to be after starting Ozempic in December, prescribed by gynecologist.  She has been under a lot of stress and gained 40 pounds she says, she was caring for her husband after a stroke for about 2 years and in the last year he is going into a nursing home which is a great relief.  She was eating a lot late at night because she was doing all the household duties after work and caring for him as well.  She says the throat  burning that is been off and on was present prior to Ozempic.  There is no overt dysphagia or constant globus sensation.  She does not drink caffeine there are no sodas or coffee or tea, denies significant sugar intake says "I just ate too much".  Rare tomatoes and citrus.  She does not think omeprazole at current dose or prior dose is making any difference.  She is a light smoker.  No significant alcohol.  No drug use.  Screening colonoscopy 01/03/2020-sigmoid diverticulosis  She is adopted so health symptoms worried her because she cannot associate family history and she wonders about cancer.  Wt Readings from Last 3 Encounters:  05/01/22 251 lb (113.9 kg)  01/03/20 235 lb (106.6 kg)  12/20/19 235 lb (106.6 kg)    No Known Allergies Current Meds  Medication Sig   buPROPion (WELLBUTRIN SR) 150 MG 12 hr tablet Take 150 mg by mouth 2 (two) times daily.   Cyanocobalamin (B-12) 100 MCG TABS Take by mouth daily.   Diethylpropion HCl CR 75 MG TB24 diethylpropion ER 75 mg tablet,extended release  TAKE 1 TABLET BY MOUTH IN THE MORNING   estradiol (ESTRACE) 0.5 MG tablet Take 0.5 mg by mouth daily.   hydrochlorothiazide (HYDRODIURIL) 25 MG tablet hydrochlorothiazide 25 mg tablet  TAKE 1 TABLET BY MOUTH ONCE DAILY   Omega-3 Fatty Acids (FISH OIL PO) Take by mouth.  OZEMPIC, 1 MG/DOSE, 4 MG/3ML SOPN Inject 1 mg into the skin once a week.   Potassium 99 MG TABS Take by mouth daily.   progesterone (PROMETRIUM) 100 MG capsule    VITAMIN D PO Take by mouth.   [DISCONTINUED] omeprazole (PRILOSEC) 40 MG capsule Take 40 mg by mouth as needed.   Past Medical History:  Diagnosis Date   Abdominal pain    Acute sinusitis    Anemia, unspecified    Anxiety    Cervicalgia    Depression    Enlarged tonsils    Hypertension 10/2011   Obesity, Class III, BMI 40-49.9 (morbid obesity) (HCC)    Palpitations    r/t anxiety   UTI (urinary tract infection)    Past Surgical History:  Procedure Laterality  Date   CARPAL TUNNEL RELEASE Right    CESAREAN SECTION     x 2   CHOLECYSTECTOMY     HYSTEROSCOPY WITH D & C  10/98,6/03,10/04   x 3   Laparoscopic Ablation of Bilateral Ovarian Cysts  1998   TOTAL ABDOMINAL HYSTERECTOMY  09/2003   Ovaries preserved, supracervical   Social History   Social History Narrative   [employer]   She is married her husband is disabled from a stroke and lives in a SNF.   family history is not on file. She was adopted.   Review of Systems As per HPI otherwise negative  Objective:   Physical Exam BP 120/74   Pulse 68   Ht 5\' 3"  (1.6 m)   Wt 251 lb (113.9 kg)   LMP  (LMP Unknown)   BMI 44.46 kg/m  WDWNNAD - obese Lungs cta Cor RRR S1S2 Abd obese soft NT Neck no TM Pharynx clear Alert and oriented x 3

## 2022-05-01 NOTE — Addendum Note (Signed)
Addended by: Iva Boop on: 05/01/2022 09:32 AM   Modules accepted: Level of Service

## 2022-05-10 ENCOUNTER — Ambulatory Visit (AMBULATORY_SURGERY_CENTER): Payer: BC Managed Care – PPO | Admitting: Internal Medicine

## 2022-05-10 ENCOUNTER — Encounter: Payer: Self-pay | Admitting: Internal Medicine

## 2022-05-10 VITALS — BP 130/77 | HR 66 | Temp 98.9°F | Resp 17 | Ht 63.0 in | Wt 251.0 lb

## 2022-05-10 DIAGNOSIS — K209 Esophagitis, unspecified without bleeding: Secondary | ICD-10-CM

## 2022-05-10 DIAGNOSIS — R12 Heartburn: Secondary | ICD-10-CM

## 2022-05-10 DIAGNOSIS — K2100 Gastro-esophageal reflux disease with esophagitis, without bleeding: Secondary | ICD-10-CM | POA: Diagnosis present

## 2022-05-10 DIAGNOSIS — K319 Disease of stomach and duodenum, unspecified: Secondary | ICD-10-CM

## 2022-05-10 DIAGNOSIS — J029 Acute pharyngitis, unspecified: Secondary | ICD-10-CM

## 2022-05-10 DIAGNOSIS — K229 Disease of esophagus, unspecified: Secondary | ICD-10-CM | POA: Diagnosis not present

## 2022-05-10 DIAGNOSIS — K2950 Unspecified chronic gastritis without bleeding: Secondary | ICD-10-CM | POA: Diagnosis not present

## 2022-05-10 DIAGNOSIS — K2970 Gastritis, unspecified, without bleeding: Secondary | ICD-10-CM

## 2022-05-10 DIAGNOSIS — F439 Reaction to severe stress, unspecified: Secondary | ICD-10-CM

## 2022-05-10 MED ORDER — PANTOPRAZOLE SODIUM 40 MG PO TBEC: 40.0000 mg | DELAYED_RELEASE_TABLET | Freq: Every day | ORAL | 3 refills | Status: AC

## 2022-05-10 MED ORDER — SODIUM CHLORIDE 0.9 % IV SOLN: 500.0000 mL | Freq: Once | INTRAVENOUS | Status: DC

## 2022-05-10 NOTE — Op Note (Signed)
[facility] Patient Name: Catherine Wheeler Procedure Date: 05/10/2022 7:18 AM MRN: [id number] Endoscopist: Iva Boop , MD Age: 56 Referring MD:  Date of Birth: 02-23-1966 Gender: Female Account #: [id number] Procedure:                Upper GI endoscopy Indications:              Heartburn, Esophageal reflux symptoms that persist                            despite appropriate therapy Medicines:                Monitored Anesthesia Care Procedure:                Pre-Anesthesia Assessment:                           - Prior to the procedure, a History and Physical                            was performed, and patient medications and                            allergies were reviewed. The patient's tolerance of                            previous anesthesia was also reviewed. The risks                            and benefits of the procedure and the sedation                            options and risks were discussed with the patient.                            All questions were answered, and informed consent                            was obtained. Prior Anticoagulants: The patient has                            taken no previous anticoagulant or antiplatelet                            agents. ASA Grade Assessment: III - A patient with                            severe systemic disease. After reviewing the risks                            and benefits, the patient was deemed in                            satisfactory condition to undergo the procedure.  After obtaining informed consent, the endoscope was                            passed under direct vision. Throughout the                            procedure, the patient's blood pressure, pulse, and                            oxygen saturations were monitored continuously. The                            Endoscope was introduced through the mouth, and                            advanced to the second  part of duodenum. The upper                            GI endoscopy was accomplished without difficulty.                            The patient tolerated the procedure well. Scope In: Scope Out: Findings:                 LA Grade A (one or more mucosal breaks less than 5                            mm, not extending between tops of 2 mucosal folds)                            esophagitis was found at the gastroesophageal                            junction. Biopsies were taken with a cold forceps                            for histology. Verification of patient                            identification for the specimen was done. Estimated                            blood loss was minimal.                           Mild inflammation characterized by erythema was                            found in the prepyloric region of the stomach.                            Biopsies were taken with a cold forceps for  histology. Verification of patient identification                            for the specimen was done. Estimated blood loss was                            minimal.                           The exam was otherwise without abnormality.                           The cardia and gastric fundus were normal on                            retroflexion. Complications:            No immediate complications. Estimated Blood Loss:     Estimated blood loss was minimal. Impression:               - LA Grade A reflux esophagitis. Biopsied.                           - Gastritis. Biopsied.                           - The examination was otherwise normal. Recommendation:           - Patient has a contact number available for                            emergencies. The signs and symptoms of potential                            delayed complications were discussed with the                            patient. Return to normal activities tomorrow.                            Written  discharge instructions were provided to the                            patient.                           - GERD diet + weight loss                           Start pantoprazole 40 mg qd (omeprazole bid was not                            effective, was off PPI at this time)                           await biopsies  resume Ozempic Iva Boop, MD 05/10/2022 8:26:21 AM This report has been signed electronically.

## 2022-05-10 NOTE — Patient Instructions (Addendum)
There were signs of inflammation at the junction of esophagus and stomach - think this is acid reflux damage. Also some inflammation in the stomach - gastritis.  I took biopsies.  No signs of cancer or ulcers, all else ok.  I have prescribed a new medication called pantoprazole to reduce acid. Let's hope that does better than omeprazole.  I appreciate the opportunity to care for you. Iva Boop, MD, [facility]  Read all of the handouts given to you by your recovery room nurse.   YOU HAD AN ENDOSCOPIC PROCEDURE TODAY AT THE [facility]:   Refer to the procedure report that was given to you for any specific questions about what was found during the examination.  If the procedure report does not answer your questions, please call your gastroenterologist to clarify.  If you requested that your care partner not be given the details of your procedure findings, then the procedure report has been included in a sealed envelope for you to review at your convenience later.  YOU SHOULD EXPECT: Some feelings of bloating in the abdomen. Passage of more gas than usual.  Walking can help get rid of the air that was put into your GI tract during the procedure and reduce the bloating.   Please Note:  You might notice some irritation and congestion in your nose or some drainage.  This is from the oxygen used during your procedure.  There is no need for concern and it should clear up in a day or so.  SYMPTOMS TO REPORT IMMEDIATELY:   Following upper endoscopy (EGD)  Vomiting of blood or coffee ground material  New chest pain or pain under the shoulder blades  Painful or persistently difficult swallowing  New shortness of breath  Fever of 100F or higher  Kuri, tarry-looking stools  For urgent or emergent issues, a gastroenterologist can be reached at any hour by calling [phone]. Do not use MyChart messaging for urgent concerns.    DIET:  We do recommend a small meal at first, but  then you may proceed to your regular diet.  Drink plenty of fluids but you should avoid alcoholic beverages for 24 hours.  ACTIVITY:  You should plan to take it easy for the rest of today and you should NOT DRIVE or use heavy machinery until tomorrow (because of the sedation medicines used during the test).    FOLLOW UP: Our staff will call the number listed on your records the next business day following your procedure.  We will call around 7:15- 8:00 am to check on you and address any questions or concerns that you may have regarding the information given to you following your procedure. If we do not reach you, we will leave a message.  If you develop any symptoms (ie: fever, flu-like symptoms, shortness of breath, cough etc.) before then, please call [phone].  If you test positive for Covid 19 in the 2 weeks post procedure, please call and report this information to [place].    If any biopsies were taken you will be contacted by phone or by letter within the next 1-3 weeks.  Please call us at [phone] if you have not heard about the biopsies in 3 weeks.    SIGNATURES/CONFIDENTIALITY: You and/or your care partner have signed paperwork which will be entered into your electronic medical record.  These signatures attest to the fact that that the information above on your After Visit Summary has been reviewed and is understood.  Full responsibility of the confidentiality of this discharge information lies with you and/or your care-partner.  

## 2022-05-10 NOTE — Progress Notes (Signed)
Called to room to assist during endoscopic procedure.  Patient ID and intended procedure confirmed with present staff. Received instructions for my participation in the procedure from the performing physician.  

## 2022-05-10 NOTE — Progress Notes (Signed)
History and Physical Interval Note:  05/10/2022 8:04 AM  Catherine Wheeler  has presented today for endoscopic procedure(s), with the diagnosis of  Encounter Diagnoses  Name Primary?   Heartburn Yes   Sore throat    Situational stress   .  The various methods of evaluation and treatment have been discussed with the patient and/or family. After consideration of risks, benefits and other options for treatment, the patient has consented to  the endoscopic procedure(s).   The patient's history has been reviewed, patient examined, no change in status, stable for endoscopic procedure(s).  I have reviewed the patient's chart and labs.  Questions were answered to the patient's satisfaction.     Iva Boop, MD, [facility]

## 2022-05-10 NOTE — Progress Notes (Signed)
VSS, transported to PACU °

## 2022-05-13 ENCOUNTER — Telehealth: Payer: Self-pay | Admitting: *Deleted

## 2022-05-13 NOTE — Telephone Encounter (Signed)
No answer on  follow up call. Left message.   

## 2022-07-02 ENCOUNTER — Ambulatory Visit: Payer: BC Managed Care – PPO | Admitting: Internal Medicine

## 2022-08-13 ENCOUNTER — Other Ambulatory Visit: Payer: Self-pay | Admitting: Obstetrics and Gynecology

## 2022-08-13 DIAGNOSIS — Z1231 Encounter for screening mammogram for malignant neoplasm of breast: Secondary | ICD-10-CM

## 2022-10-11 ENCOUNTER — Ambulatory Visit
Admission: RE | Admit: 2022-10-11 | Discharge: 2022-10-11 | Disposition: A | Discharge status: Home / Self Care | Payer: BC Managed Care – PPO | Source: Ambulatory Visit | Attending: Obstetrics and Gynecology | Admitting: Obstetrics and Gynecology

## 2022-10-11 DIAGNOSIS — Z1231 Encounter for screening mammogram for malignant neoplasm of breast: Secondary | ICD-10-CM

## 2023-08-28 ENCOUNTER — Other Ambulatory Visit: Payer: Self-pay | Admitting: Obstetrics and Gynecology

## 2023-08-28 DIAGNOSIS — Z1231 Encounter for screening mammogram for malignant neoplasm of breast: Secondary | ICD-10-CM

## 2023-10-17 ENCOUNTER — Ambulatory Visit
Admission: RE | Admit: 2023-10-17 | Discharge: 2023-10-17 | Disposition: A | Discharge status: Home / Self Care | Payer: BC Managed Care – PPO | Source: Ambulatory Visit | Attending: Obstetrics and Gynecology | Admitting: Obstetrics and Gynecology

## 2023-10-17 DIAGNOSIS — Z1231 Encounter for screening mammogram for malignant neoplasm of breast: Secondary | ICD-10-CM

## 2024-09-28 ENCOUNTER — Other Ambulatory Visit: Payer: Self-pay | Admitting: Obstetrics and Gynecology

## 2024-09-28 DIAGNOSIS — Z1231 Encounter for screening mammogram for malignant neoplasm of breast: Secondary | ICD-10-CM

## 2024-10-27 ENCOUNTER — Ambulatory Visit
Admission: RE | Admit: 2024-10-27 | Discharge: 2024-10-27 | Disposition: A | Discharge status: Home / Self Care | Source: Ambulatory Visit | Attending: Obstetrics and Gynecology | Admitting: Obstetrics and Gynecology

## 2024-10-27 DIAGNOSIS — Z1231 Encounter for screening mammogram for malignant neoplasm of breast: Secondary | ICD-10-CM
